# Patient Record
Sex: Female | Born: 1945 | Race: White | Hispanic: No | Smoking: Never smoker
Health system: Southern US, Community
[De-identification: ages and names within clinical notes are randomized; demographics above are authoritative.]

## PROBLEM LIST (undated history)

## (undated) DIAGNOSIS — M1990 Unspecified osteoarthritis, unspecified site: Secondary | ICD-10-CM

---

## 2016-01-15 ENCOUNTER — Encounter (HOSPITAL_COMMUNITY): Payer: Self-pay | Admitting: Emergency Medicine

## 2016-01-15 ENCOUNTER — Observation Stay (HOSPITAL_COMMUNITY)
Admission: EM | Admit: 2016-01-15 | Discharge: 2016-01-16 | Disposition: A | Discharge status: Home / Self Care | Payer: Medicare Other | Attending: Family Medicine | Admitting: Family Medicine

## 2016-01-15 ENCOUNTER — Emergency Department (HOSPITAL_COMMUNITY): Payer: Medicare Other

## 2016-01-15 ENCOUNTER — Observation Stay (HOSPITAL_COMMUNITY): Payer: Medicare Other

## 2016-01-15 DIAGNOSIS — E669 Obesity, unspecified: Secondary | ICD-10-CM | POA: Insufficient documentation

## 2016-01-15 DIAGNOSIS — Z880 Allergy status to penicillin: Secondary | ICD-10-CM | POA: Insufficient documentation

## 2016-01-15 DIAGNOSIS — M1990 Unspecified osteoarthritis, unspecified site: Secondary | ICD-10-CM

## 2016-01-15 DIAGNOSIS — Z6835 Body mass index (BMI) 35.0-35.9, adult: Secondary | ICD-10-CM | POA: Diagnosis not present

## 2016-01-15 DIAGNOSIS — R030 Elevated blood-pressure reading, without diagnosis of hypertension: Secondary | ICD-10-CM

## 2016-01-15 DIAGNOSIS — Z8042 Family history of malignant neoplasm of prostate: Secondary | ICD-10-CM | POA: Diagnosis not present

## 2016-01-15 DIAGNOSIS — R413 Other amnesia: Secondary | ICD-10-CM

## 2016-01-15 DIAGNOSIS — Z791 Long term (current) use of non-steroidal anti-inflammatories (NSAID): Secondary | ICD-10-CM | POA: Insufficient documentation

## 2016-01-15 DIAGNOSIS — Z8249 Family history of ischemic heart disease and other diseases of the circulatory system: Secondary | ICD-10-CM | POA: Insufficient documentation

## 2016-01-15 DIAGNOSIS — I119 Hypertensive heart disease without heart failure: Secondary | ICD-10-CM | POA: Diagnosis not present

## 2016-01-15 DIAGNOSIS — Z823 Family history of stroke: Secondary | ICD-10-CM | POA: Insufficient documentation

## 2016-01-15 DIAGNOSIS — R4182 Altered mental status, unspecified: Secondary | ICD-10-CM | POA: Diagnosis not present

## 2016-01-15 HISTORY — DX: Unspecified osteoarthritis, unspecified site: M19.90

## 2016-01-15 LAB — COMPREHENSIVE METABOLIC PANEL
ALT: 18 U/L (ref 14–54)
ANION GAP: 12 (ref 5–15)
AST: 24 U/L (ref 15–41)
Albumin: 4.1 g/dL (ref 3.5–5.0)
Alkaline Phosphatase: 93 U/L (ref 38–126)
BILIRUBIN TOTAL: 0.5 mg/dL (ref 0.3–1.2)
BUN: 14 mg/dL (ref 6–20)
CHLORIDE: 107 mmol/L (ref 101–111)
CO2: 22 mmol/L (ref 22–32)
Calcium: 9.8 mg/dL (ref 8.9–10.3)
Creatinine, Ser: 0.8 mg/dL (ref 0.44–1.00)
Glucose, Bld: 115 mg/dL — ABNORMAL HIGH (ref 65–99)
POTASSIUM: 4.2 mmol/L (ref 3.5–5.1)
Sodium: 141 mmol/L (ref 135–145)
TOTAL PROTEIN: 7.6 g/dL (ref 6.5–8.1)

## 2016-01-15 LAB — CBC WITH DIFFERENTIAL/PLATELET
Basophils Absolute: 0 10*3/uL (ref 0.0–0.1)
Basophils Relative: 0 %
EOS PCT: 1 %
Eosinophils Absolute: 0.1 10*3/uL (ref 0.0–0.7)
HEMATOCRIT: 44.2 % (ref 36.0–46.0)
Hemoglobin: 15.1 g/dL — ABNORMAL HIGH (ref 12.0–15.0)
LYMPHS PCT: 17 %
Lymphs Abs: 1.5 10*3/uL (ref 0.7–4.0)
MCH: 27.8 pg (ref 26.0–34.0)
MCHC: 34.2 g/dL (ref 30.0–36.0)
MCV: 81.4 fL (ref 78.0–100.0)
MONO ABS: 0.6 10*3/uL (ref 0.1–1.0)
MONOS PCT: 7 %
NEUTROS ABS: 6.7 10*3/uL (ref 1.7–7.7)
Neutrophils Relative %: 75 %
PLATELETS: 297 10*3/uL (ref 150–400)
RBC: 5.43 MIL/uL — ABNORMAL HIGH (ref 3.87–5.11)
RDW: 13 % (ref 11.5–15.5)
WBC: 8.9 10*3/uL (ref 4.0–10.5)

## 2016-01-15 LAB — SALICYLATE LEVEL: Salicylate Lvl: <7 mg/dL (ref 2.8–30.0)

## 2016-01-15 LAB — ETHANOL

## 2016-01-15 LAB — URINE MICROSCOPIC-ADD ON

## 2016-01-15 LAB — I-STAT TROPONIN, ED: TROPONIN I, POC: 0 ng/mL (ref 0.00–0.08)

## 2016-01-15 LAB — URINALYSIS, ROUTINE W REFLEX MICROSCOPIC
BILIRUBIN URINE: NEGATIVE
Glucose, UA: NEGATIVE mg/dL
KETONES UR: NEGATIVE mg/dL
NITRITE: NEGATIVE
PH: 6 (ref 5.0–8.0)
PROTEIN: NEGATIVE mg/dL
Specific Gravity, Urine: 1.009 (ref 1.005–1.030)

## 2016-01-15 LAB — RAPID URINE DRUG SCREEN, HOSP PERFORMED
Amphetamines: NOT DETECTED
BARBITURATES: NOT DETECTED
Benzodiazepines: NOT DETECTED
COCAINE: NOT DETECTED
Opiates: NOT DETECTED
TETRAHYDROCANNABINOL: NOT DETECTED

## 2016-01-15 LAB — TSH: TSH: 1.856 u[IU]/mL (ref 0.350–4.500)

## 2016-01-15 LAB — ACETAMINOPHEN LEVEL

## 2016-01-15 MED ORDER — THIAMINE HCL 100 MG/ML IJ SOLN
100.0000 mg | Freq: Every day | INTRAMUSCULAR | Status: DC
  Administered 2016-01-15 – 2016-01-16 (×2): 100 mg via INTRAVENOUS
  Filled 2016-01-15 (×2): qty 2

## 2016-01-15 MED ORDER — SODIUM CHLORIDE 0.9% FLUSH: 3.0000 mL | INTRAVENOUS | Status: DC | PRN

## 2016-01-15 MED ORDER — STROKE: EARLY STAGES OF RECOVERY BOOK
Freq: Once | Status: AC
  Administered 2016-01-15: 19:00:00

## 2016-01-15 MED ORDER — SODIUM CHLORIDE 0.9 % IV SOLN: 250.0000 mL | INTRAVENOUS | Status: DC | PRN

## 2016-01-15 MED ORDER — SODIUM CHLORIDE 0.9% FLUSH
3.0000 mL | Freq: Two times a day (BID) | INTRAVENOUS | Status: DC
  Administered 2016-01-15 – 2016-01-16 (×2): 3 mL via INTRAVENOUS

## 2016-01-15 MED ORDER — LABETALOL HCL 5 MG/ML IV SOLN: 10.0000 mg | Freq: Once | INTRAVENOUS | Status: DC

## 2016-01-15 MED ORDER — SENNOSIDES-DOCUSATE SODIUM 8.6-50 MG PO TABS: 1.0000 | ORAL_TABLET | Freq: Every evening | ORAL | Status: DC | PRN

## 2016-01-15 MED ORDER — ACETAMINOPHEN 650 MG RE SUPP: 650.0000 mg | RECTAL | Status: DC | PRN

## 2016-01-15 MED ORDER — ACETAMINOPHEN 325 MG PO TABS: 650.0000 mg | ORAL_TABLET | ORAL | Status: DC | PRN

## 2016-01-15 NOTE — ED Notes (Signed)
Called to notify that patient is in MRI and will be transported upstairs after she returns to ED Room.

## 2016-01-15 NOTE — Progress Notes (Signed)
Patient admitted from ED. Patient alert and oriented x 2. Patient and family oriented to room and made comfortable. Tele placed and was verified.

## 2016-01-15 NOTE — ED Triage Notes (Signed)
Pt to ER BIB [facility] from home. Pt drove herself to the fire station "not sure why." Pt is experiencing confusion, not per baseline per daughter, repetitive questioning noted. No hx of CVA. No medical hx per patient. No medications per patient. No neuro deficits at this time. BP noted to be 210/109, on arrival 201/99, HR 110 ST. Daughter at bedside stating saw patient at home this morning at 8 am and patient was at baseline a/o x4. At this time patient is alert to self and place, unsure of time/day.

## 2016-01-15 NOTE — ED Notes (Signed)
Patient ambulated to restroom with steady gait.

## 2016-01-15 NOTE — Discharge Summary (Signed)
[facility]  Patient name: Taylor KillDiane Stokes Medical record number: [id number] Date of birth: [birth year] Age: 70 y.o. Gender: female Date of Admission: 01/15/2016  Date of Discharge: 01/16/16 Admitting Physician: Nestor RampSara L Neal, MD  Primary Care Provider: Ellene Routeoyle, Natalie Consultants: neuro  Indication for Hospitalization: altered mental status/ amnesia  Discharge Diagnoses/Problem List:  Principal Problem:   Altered mental status Active Problems:   Osteoarthritis   Elevated blood-pressure reading without diagnosis of hypertension  Disposition: Discharge home  Discharge Condition: Stable  Discharge Exam:  From Progress Note from Day of Discharge:  Temp:  [97.4 F (36.3 C)-99.5 F (37.5 C)] 98.4 F (36.9 C) (11/05 0600) Pulse Rate:  [72-115] 74 (11/05 0600) Resp:  [14-26] 17 (11/05 0600) BP: (129-201)/(53-104) 144/61 (11/05 0600) SpO2:  [95 %-99 %] 99 % (11/05 0600) Weight:  [243 lb 6.2 oz (110.4 kg)] 243 lb 6.2 oz (110.4 kg) (11/04 1828) Physical Exam: General: resting in bed, easily roused, NAD Cardiovascular: RRR, no murmurs Respiratory: CTAB, normal WOB on room air Abdomen: obese, soft, NT/ND, +BS Extremities: WWP, no cyanosis/ edema Neuro:  Follows commands, no focal deficits, AOx3, 5/5 UE and LE strength. Light touch sensation in tact.  Brief Hospital Course:  Taylor Hubbard is a 70 y.o. female that [facility] with amnesia.  On admission, she was noted to have a BP 201/99, which normalized without intervention.  She was unaware of how she arrived to ED.  Her exam was non-focal except for repeatedly asking the same questions every 30-45 seconds.  Her long term memory seemed to be preserved.  CT head was negative for acute processes.  Her CBC and CMP were unremarkable.  iTroponin was negative.  Blood alcohol level negative.  Neurology was consulted and recommended MRI of brain with continued stroke work up but believed patient  to have transient global amnesia.  MRI brain was unremarkable.  UDS was negative, Tylenol level negative, Aspirin level negative.  She had a rohpynol level sent out, which was pending at discahrge.  Risk stratification labs for stroke were obtained and are noted below. Patient did not require PT. OT evaluated patient and did not have further recommendations. Patient was started on ASA 81mg  daily per neurology reccomendations.   Patient had no apparent risk factors for stroke.  Her only PMH was Osteoarthritis.  Her home Mobic was held during hospitalization.  Issues for Follow Up:  - Elevated BP   Significant Procedures: none  Significant Labs and Imaging:   Recent Labs Lab 01/15/16 1437 01/16/16 0700  WBC 8.9 8.3  HGB 15.1* 15.1*  HCT 44.2 45.9  PLT 297 321    Recent Labs Lab 01/15/16 1437  NA 141  K 4.2  CL 107  CO2 22  GLUCOSE 115*  BUN 14  CREATININE 0.80  CALCIUM 9.8  ALKPHOS 93  AST 24  ALT 18  ALBUMIN 4.1  TSH 1.856 UDS: negative UA: trace Leukocytes, small hgb  istat troponin 0.00 Ethanol <5 Salicylate: <7 Acetaminophen: < 10  Lipid: total cholesterol 195, TAG 121, HDL 47, VLDL 24, LDL 124.  Hemoblobin A1c: pending on discharge   Ct Head Wo Contrast  Result Date: 01/15/2016 CLINICAL DATA:  Amnesia, confusion EXAM: CT HEAD WITHOUT CONTRAST TECHNIQUE: Contiguous axial images were obtained from the base of the skull through the vertex without intravenous contrast. COMPARISON:  None available FINDINGS: Brain: No evidence of acute infarction, hemorrhage, hydrocephalus, extra-axial collection or mass lesion/mass effect. Vascular: No hyperdense vessel or  unexpected calcification. Skull: Normal. Negative for fracture or focal lesion. Sinuses/Orbits: No acute finding. Other: None. IMPRESSION: Normal head CT without contrast for age. Electronically Signed   By: Judie PetitM.  Shick M.D.   On: 01/15/2016 15:24   Mr Brain Wo Contrast  Result Date: 01/15/2016 CLINICAL DATA:   Initial evaluation for acute amnesia. EXAM: MRI HEAD WITHOUT CONTRAST TECHNIQUE: Multiplanar, multiecho pulse sequences of the brain and surrounding structures were obtained without intravenous contrast. COMPARISON:  Prior CT from 01/15/2016. FINDINGS: Brain: Cerebral volume within normal limits for age. No focal parenchymal signal abnormality identified. No significant cerebral white matter disease. No abnormal foci of restricted diffusion to suggest acute or subacute ischemia. Gray-white matter differentiation well maintained. No evidence for acute or chronic intracranial hemorrhage. No areas of chronic infarction identified. No mass lesion, midline shift, or mass effect. No hydrocephalus. No extra-axial fluid collection. Major dural sinuses are grossly patent. Pituitary gland within normal limits. Vascular: Major intracranial vascular flow voids are maintained. Skull and upper cervical spine: Craniocervical junction normal. Visualized upper cervical spine unremarkable. Bone marrow signal intensity within normal limits. No scalp soft tissue abnormality. Sinuses/Orbits: Globes and orbital soft tissues within normal limits. Paranasal sinuses are clear. No mastoid effusion. Inner ear structures within normal limits. IMPRESSION: Normal brain MRI. Electronically Signed   By: Rise MuBenjamin  McClintock M.D.   On: 01/15/2016 17:51   Results/Tests Pending at Time of Discharge: hemoglobin A1c, rohpynol level  Discharge Medications:    Medication List    TAKE these medications   aspirin EC 81 MG tablet Take 1 tablet (81 mg total) by mouth daily.   meloxicam 7.5 MG tablet Commonly known as:  MOBIC Take 7.5 mg by mouth daily.       Discharge Instructions: Please refer to Patient Instructions section of EMR for full details.  Patient was counseled important signs and symptoms that should prompt return to medical care, changes in medications, dietary instructions, activity restrictions, and follow up appointments.    Follow-Up Appointments: Follow-up Information    Ellene Routeatalie Doyle Follow up.   Specialty:  Internal Medicine Why:  Please make a hospital follow up appointment as soon as possible.  Contact information: [address] [place] [id number] [phone]           Palma HolterKanishka G Gunadasa, MD 01/16/2016, 12:47 PM PGY-2, [facility]

## 2016-01-15 NOTE — ED Notes (Signed)
Pt delayed to go to the floor due to IV infiltrating

## 2016-01-15 NOTE — ED Notes (Signed)
Patient transported to MRI 

## 2016-01-15 NOTE — Consult Note (Signed)
Reason for Consult: Memory loss Referring Physician: ER  Taylor Hubbard is an 70 y.o. female.  HPI: Patient is visiting daughters from [place].  She was last seen this am 8 am by one of the daughters and was normal.  At 2:30 pm, the daughter was called by fire dept as she had driven there confused.  She has no recollection of what happened.  She keeps repeating the same questions over and over again.  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  No definite intense physical activity, sex, or cold shower.  She does not have any stroke risk factors that she knows of, but her BP has been around 220/110 upon arrival..    History reviewed. No pertinent past medical history.  History reviewed. No pertinent surgical history.  Family History  Problem Relation Age of Onset  . Stroke Mother     Social History:  reports that she has never smoked. She has never used smokeless tobacco. She reports that she does not drink alcohol. Her drug history is not on file.  Allergies:  Allergies  Allergen Reactions  . Penicillins Rash    Prior to Admission medications   Medication Sig Start Date End Date Taking? Authorizing Provider  meloxicam (MOBIC) 7.5 MG tablet Take 7.5 mg by mouth daily.   Yes Historical Provider, MD    Medications: Prior to Admission:  (Not in a hospital admission)  Results for orders placed or performed during the hospital encounter of 01/15/16 (from the past 48 hour(s))  CBC with Differential     Status: Abnormal   Collection Time: 01/15/16  2:37 PM  Result Value Ref Range   WBC 8.9 4.0 - 10.5 K/uL   RBC 5.43 (H) 3.87 - 5.11 MIL/uL   Hemoglobin 15.1 (H) 12.0 - 15.0 g/dL   HCT 44.2 36.0 - 46.0 %   MCV 81.4 78.0 - 100.0 fL   MCH 27.8 26.0 - 34.0 pg   MCHC 34.2 30.0 - 36.0 g/dL   RDW 13.0 11.5 - 15.5 %   Platelets 297 150 - 400 K/uL   Neutrophils Relative % 75 %   Neutro Abs 6.7 1.7 - 7.7 K/uL   Lymphocytes Relative 17 %   Lymphs Abs 1.5 0.7  - 4.0 K/uL   Monocytes Relative 7 %   Monocytes Absolute 0.6 0.1 - 1.0 K/uL   Eosinophils Relative 1 %   Eosinophils Absolute 0.1 0.0 - 0.7 K/uL   Basophils Relative 0 %   Basophils Absolute 0.0 0.0 - 0.1 K/uL  Comprehensive metabolic panel     Status: Abnormal   Collection Time: 01/15/16  2:37 PM  Result Value Ref Range   Sodium 141 135 - 145 mmol/L   Potassium 4.2 3.5 - 5.1 mmol/L   Chloride 107 101 - 111 mmol/L   CO2 22 22 - 32 mmol/L   Glucose, Bld 115 (H) 65 - 99 mg/dL   BUN 14 6 - 20 mg/dL   Creatinine, Ser 0.80 0.44 - 1.00 mg/dL   Calcium 9.8 8.9 - 10.3 mg/dL   Total Protein 7.6 6.5 - 8.1 g/dL   Albumin 4.1 3.5 - 5.0 g/dL   AST 24 15 - 41 U/L   ALT 18 14 - 54 U/L   Alkaline Phosphatase 93 38 - 126 U/L   Total Bilirubin 0.5 0.3 - 1.2 mg/dL   GFR calc non Af Amer >60 >60 mL/min   GFR calc Af Amer >60 >60 mL/min  Comment: (NOTE) The eGFR has been calculated using the CKD EPI equation. This calculation has not been validated in all clinical situations. eGFR's persistently <60 mL/min signify possible Chronic Kidney Disease.    Anion gap 12 5 - 15  I-Stat Troponin, ED (not at [facility])     Status: None   Collection Time: 01/15/16  2:40 PM  Result Value Ref Range   Troponin i, poc 0.00 0.00 - 0.08 ng/mL   Comment 3            Comment: Due to the release kinetics of cTnI, a negative result within the first hours of the onset of symptoms does not rule out myocardial infarction with certainty. If myocardial infarction is still suspected, repeat the test at appropriate intervals.     Ct Head Wo Contrast  Result Date: 01/15/2016 CLINICAL DATA:  Amnesia, confusion EXAM: CT HEAD WITHOUT CONTRAST TECHNIQUE: Contiguous axial images were obtained from the base of the skull through the vertex without intravenous contrast. COMPARISON:  None available FINDINGS: Brain: No evidence of acute infarction, hemorrhage, hydrocephalus, extra-axial collection or mass lesion/mass effect.  Vascular: No hyperdense vessel or unexpected calcification. Skull: Normal. Negative for fracture or focal lesion. Sinuses/Orbits: No acute finding. Other: None. IMPRESSION: Normal head CT without contrast for age. Electronically Signed   By: Jerilynn Mages.  Shick M.D.   On: 01/15/2016 15:24    ROS Blood pressure 186/97, pulse 97, temperature 97.4 F (36.3 C), resp. rate 20, last menstrual period 01/15/2016, SpO2 96 %. Neurologic Examination:  Normal completely except repeating the same questions over and over again.  Assessment/Plan:  Transient Global Amnesia.  No definite provocative factor identified.  Most cases are not associated with a completed stroke, but would get MRI to ensure no infarct in the hippocampus.  Most prevailing theory is that it occurs due to incompetent internal jugular veins allowing deoxygenated blood to bathe the hippocampi.  Most cases resolve within 24 hours.    If there is no stroke on MRI, I would be more aggressive in controlling her BP.  If there is , then permissive hypertension for 24 hours.    Admit for observation overnight.     Rogue Jury, MD 01/15/2016, 3:43 PM

## 2016-01-15 NOTE — H&P (Signed)
[facility] Ad[facility] History and Physical Service Pager: [phone]  Patient name: Taylor Hubbard Medical record number: [id number] Date of birth: 1946/01/11 Age: 70 y.o. Gender: female  Primary Care Provider: Ellene Routeoyle, Natalie Consultants: None Code Status: Full  Chief Complaint: Altered mental status  Assessment and Plan: Taylor Hubbard is a 70 y.o. female presenting with AMS. PMH is significant for obesity and osteoarthritis otherwise negative.  #Altered Mental Status, Acute, Unknown Source: New onset 11/04 sometime around 1430. 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 BP has since improved to 158/74 without medication.  CMP and CBC unremarkable.  iTrop neg. Blood alcohol level neg. CT of head w/o contrast neg for ischemia or hemorrhaging. MRI pending. Patient was evaluated by neurology who did not appreciate deficits other than short term memory loss. Patient repeats questions about 30 seconds apart in coherent sentences without slurring of speech. She is A&O only to self and long term memory appears to be intact. Neuro exam was unremarkable for other focal deficits. Patient denies h/o previous TIA/stroke or memory impairment and daughters confirm. Never smoker and denies EtOH use or h/o illicit drug exposure. No history of significant risk factors including HTN, DM2, early family h/o Stroke/ Cardiovascular disease.  Ddx includes transient global amnesia, stroke, drug exposure. --Admit to obs on med-surg, attending Dr. Jennette KettleNeal --Thiamine 100 mg IV QD --MRI brain w/o contrast pending --Will obtain carotid doppler --Will obtain ECHO --Neuro aware, appreciate recs, will consult if MRI is concerning or change in mentation worsens or does not improve over next 24 hrs --Neuro check q2h for first 12 hrs, then q4h --Risk  stratification labs pending including TSH, lipid, A1c --Salicylate, Tylenol levels --UDS pending including send out for rohypnol --Vitals q2h for first 12 hrs, then q4h --PT/OT, SLP evaluation --Fall precautions  #Osteoarthritis, Chronic, Controlled:  On mobic qd prn, takes glucosamine/chondroitin at home. --Hold NSAID for now given concern for stroke --Tylenol 650 mg q4h --Monitor pain level  FEN/GI: NPO, KVO Prophylaxis: SCDs  Disposition: Pending stroke workup  History of Present Illness:  Taylor Hubbard is a 70 y.o. female presenting with AMS. PMH is significant for obesity and osteoarthritis otherwise negative.  Onset around 1430 today when patient drove to a local fire department in her car alone and was found repeating questions in confusion and was hypertensive in the 200-210/90-100. [place] in [place] and is visiting for a wedding today which she was running errands for earlier according to the daughter. Patient was seen by another daughter around 0800 today and was noted to be in normal state with appropriate mentation. Her only known medical problems are obesity and OA but otherwise negative for HTN, HLD, DM2, CAD, prior TIA/stroke or h/o dementia, memory loss. She has never experienced similar episode before. Resides independently at home with full ADLs and is active daily. Denies fevers, chills, headache, nausea, vomiting, visual disturbance, dyspnea, chest pain, or change in speech.  No known exposures to chemicals, drugs.  Does not drink, smoke or take drugs.  Review Of Systems: Complete ROS performed, please refer to HPI for pertinent ROS.  Patient Active Problem List   Diagnosis Date Noted  . Altered mental status 01/15/2016  . Osteoarthritis 01/15/2016  . Elevated blood-pressure reading without diagnosis of hypertension 01/15/2016    Past Medical History: Past Medical History:  Diagnosis Date  . Osteoarthritis  Past Surgical History: History  reviewed. No pertinent surgical history.  Social History: Social History  Substance Use Topics  . Smoking status: Never Smoker  . Smokeless tobacco: Never Used  . Alcohol use No   Additional social history: denies illicit drug exposure. Please also refer to relevant sections of EMR.  Family History: Family History  Problem Relation Age of Onset  . Stroke Mother 87  . Atrial fibrillation Father71   . Prostate cancer Father     Allergies and Medications: Allergies  Allergen Reactions  . Penicillins Rash   No current facility-administered medications on file prior to encounter.    No current outpatient prescriptions on file prior to encounter.    Objective: BP 158/74   Pulse 100   Temp 97.4 F (36.3 C)   Resp 26   LMP 01/15/2016   SpO2 97%  Exam: General: obese, well nourished, well developed, in no acute distress with non-toxic appearance HEENT: normocephalic, atraumatic, moist mucous membranes, PERRLA, EOMI, sclera anicteric Neck: supple, non-tender without lymphadenopathy CV: regular rate and rhythm without murmurs, rubs, or gallops Lungs: clear to auscultation bilaterally with normal work of breathing Abdomen: soft, non-tender, no masses or organomegaly palpable, normoactive bowel sounds Skin: warm, dry, no rashes or lesions, cap refill < 2 seconds Extremities: warm and well perfused, normal tone Neuro: CNII-XII intact, no fine-motor deficits, no slurring or speech, ambulates independently without gait abnormalities Psych: A&O only to self, appropriate mood and affect, knows she's in a hospital and that it is fall  Labs and Imaging: Results for orders placed or performed during the hospital encounter of 01/15/16 (from the past 24 hour(s))  CBC with Differential     Status: Abnormal   Collection Time: 01/15/16  2:37 PM  Result Value Ref Range   WBC 8.9 4.0 - 10.5 K/uL   RBC 5.43 (H) 3.87 - 5.11 MIL/uL   Hemoglobin 15.1 (H) 12.0 - 15.0 g/dL   HCT 16.144.2 09.636.0 -  04.546.0 %   MCV 81.4 78.0 - 100.0 fL   MCH 27.8 26.0 - 34.0 pg   MCHC 34.2 30.0 - 36.0 g/dL   RDW 40.913.0 81.111.5 - 91.415.5 %   Platelets 297 150 - 400 K/uL   Neutrophils Relative % 75 %   Neutro Abs 6.7 1.7 - 7.7 K/uL   Lymphocytes Relative 17 %   Lymphs Abs 1.5 0.7 - 4.0 K/uL   Monocytes Relative 7 %   Monocytes Absolute 0.6 0.1 - 1.0 K/uL   Eosinophils Relative 1 %   Eosinophils Absolute 0.1 0.0 - 0.7 K/uL   Basophils Relative 0 %   Basophils Absolute 0.0 0.0 - 0.1 K/uL  Comprehensive metabolic panel     Status: Abnormal   Collection Time: 01/15/16  2:37 PM  Result Value Ref Range   Sodium 141 135 - 145 mmol/L   Potassium 4.2 3.5 - 5.1 mmol/L   Chloride 107 101 - 111 mmol/L   CO2 22 22 - 32 mmol/L   Glucose, Bld 115 (H) 65 - 99 mg/dL   BUN 14 6 - 20 mg/dL   Creatinine, Ser 7.820.80 0.44 - 1.00 mg/dL   Calcium 9.8 8.9 - 95.610.3 mg/dL   Total Protein 7.6 6.5 - 8.1 g/dL   Albumin 4.1 3.5 - 5.0 g/dL   AST 24 15 - 41 U/L   ALT 18 14 - 54 U/L   Alkaline Phosphatase 93 38 - 126 U/L   Total Bilirubin 0.5 0.3 - 1.2 mg/dL   GFR  calc non Af Amer >60 >60 mL/min   GFR calc Af Amer >60 >60 mL/min   Anion gap 12 5 - 15  Acetaminophen level     Status: Abnormal   Collection Time: 01/15/16  2:37 PM  Result Value Ref Range   Acetaminophen (Tylenol), Serum <10 (L) 10 - 30 ug/mL  Salicylate level     Status: None   Collection Time: 01/15/16  2:37 PM  Result Value Ref Range   Salicylate Lvl <7.0 2.8 - 30.0 mg/dL  Ethanol     Status: None   Collection Time: 01/15/16  2:37 PM  Result Value Ref Range   Alcohol, Ethyl (B) <5 <5 mg/dL  I-Stat Troponin, ED (not at [facility])     Status: None   Collection Time: 01/15/16  2:40 PM  Result Value Ref Range   Troponin i, poc 0.00 0.00 - 0.08 ng/mL   Comment 3          Urinalysis, Routine w reflex microscopic (not at [facility])     Status: Abnormal   Collection Time: 01/15/16  2:40 PM  Result Value Ref Range   Color, Urine YELLOW YELLOW   APPearance CLEAR CLEAR    Specific Gravity, Urine 1.009 1.005 - 1.030   pH 6.0 5.0 - 8.0   Glucose, UA NEGATIVE NEGATIVE mg/dL   Hgb urine dipstick SMALL (A) NEGATIVE   Bilirubin Urine NEGATIVE NEGATIVE   Ketones, ur NEGATIVE NEGATIVE mg/dL   Protein, ur NEGATIVE NEGATIVE mg/dL   Nitrite NEGATIVE NEGATIVE   Leukocytes, UA TRACE (A) NEGATIVE  Urine microscopic-add on     Status: Abnormal   Collection Time: 01/15/16  2:40 PM  Result Value Ref Range   Squamous Epithelial / LPF 0-5 (A) NONE SEEN   WBC, UA 0-5 0 - 5 WBC/hpf   RBC / HPF 0-5 0 - 5 RBC/hpf   Bacteria, UA RARE (A) NONE SEEN    Ct Head Wo Contrast  Result Date: 01/15/2016 CLINICAL DATA:  Amnesia, confusion EXAM: CT HEAD WITHOUT CONTRAST TECHNIQUE: Contiguous axial images were obtained from the base of the skull through the vertex without intravenous contrast. COMPARISON:  None available FINDINGS: Brain: No evidence of acute infarction, hemorrhage, hydrocephalus, extra-axial collection or mass lesion/mass effect. Vascular: No hyperdense vessel or unexpected calcification. Skull: Normal. Negative for fracture or focal lesion. Sinuses/Orbits: No acute finding. Other: None. IMPRESSION: Normal head CT without contrast for age. Electronically Signed   By: Judie Petit.  Shick M.D.   On: 01/15/2016 15:24   Wendee Beavers, DO 01/15/2016, 5:31 PM PGY-1, [facility] Intern pager: [phone], text pages welcome  I have separately seen and examined the patient. I have discussed the findings and exam with Dr Abelardo Diesel and agree with the above note.  My changes/additions are outlined in BLUE.   Daimien Patmon M. Nadine Counts, DO PGY-3, [facility]

## 2016-01-15 NOTE — ED Provider Notes (Signed)
[facility]-EMERGENCY DEPT Provider Note   CSN: [id number] Arrival date & time: 01/15/16  1412     History   Chief Complaint Chief Complaint  Patient presents with  . Altered Mental Status    HPI Taylor Hubbard is a 70 y.o. female.  HPI Taylor KillDiane Vanhouten is a 70 y.o. female with no medical problems, presents to ED with complaint of altered mental status. Pt apparently drove herself to a fire station this afternoon. Pt does not recall getting there or any of the events that happened this morning. Pt with repetitive questions. No hx of the same or memory issues. No recent illnesses. Last seen normal at 8am. Denies headache. No pain anywhere else. No dizziness, light headiness, weakness or numbness in extremities. No other complaints.   History reviewed. No pertinent past medical history.  There are no active problems to display for this patient.   History reviewed. No pertinent surgical history.  OB History    No data available       Home Medications    Prior to Admission medications   Not on File    Family History Family History  Problem Relation Age of Onset  . Stroke Mother     Social History Social History  Substance Use Topics  . Smoking status: Never Smoker  . Smokeless tobacco: Never Used  . Alcohol use No     Allergies   Penicillins   Review of Systems Review of Systems  Constitutional: Negative for chills and fever.  Respiratory: Negative for cough, chest tightness and shortness of breath.   Cardiovascular: Negative for chest pain, palpitations and leg swelling.  Gastrointestinal: Negative for abdominal pain, diarrhea, nausea and vomiting.  Genitourinary: Negative for dysuria and flank pain.  Musculoskeletal: Negative for arthralgias, myalgias, neck pain and neck stiffness.  Skin: Negative for rash.  Neurological: Negative for dizziness, weakness and headaches.  Psychiatric/Behavioral: Positive for confusion.  All other systems reviewed and are  negative.    Physical Exam Updated Vital Signs BP (!) 201/99 (BP Location: Right Arm)   Pulse 110   Temp 97.4 F (36.3 C) (Oral)   Resp 14   LMP 01/15/2016   SpO2 98%   Physical Exam  Constitutional: She appears well-developed and well-nourished. No distress.  HENT:  Head: Normocephalic.  Eyes: Conjunctivae and EOM are normal. Pupils are equal, round, and reactive to light.  Neck: Normal range of motion. Neck supple.  Cardiovascular: Normal rate, regular rhythm and normal heart sounds.   Pulmonary/Chest: Effort normal and breath sounds normal. No respiratory distress. She has no wheezes. She has no rales.  Abdominal: Soft. Bowel sounds are normal. She exhibits no distension. There is no tenderness. There is no rebound.  Musculoskeletal: She exhibits no edema.  Neurological: She is alert. No cranial nerve deficit. She exhibits normal muscle tone. Coordination normal.  oriented x2 to self and place. Repetitive questioning.  5/5 and equal upper and lower extremity strength bilaterally. Equal grip strength bilaterally. Normal finger to nose and heel to shin. No pronator drift.   Skin: Skin is warm and dry. Capillary refill takes less than 2 seconds.  Psychiatric: She has a normal mood and affect. Her behavior is normal.  Nursing note and vitals reviewed.    ED Treatments / Results  Labs (all labs ordered are listed, but only abnormal results are displayed) Labs Reviewed  CBC WITH DIFFERENTIAL/PLATELET - Abnormal; Notable for the following:       Result Value   RBC 5.43 (*)  Hemoglobin 15.1 (*)    All other components within normal limits  COMPREHENSIVE METABOLIC PANEL - Abnormal; Notable for the following:    Glucose, Bld 115 (*)    All other components within normal limits  URINALYSIS, ROUTINE W REFLEX MICROSCOPIC (NOT AT [facility]) - Abnormal; Notable for the following:    Hgb urine dipstick SMALL (*)    Leukocytes, UA TRACE (*)    All other components within normal limits    URINE MICROSCOPIC-ADD ON - Abnormal; Notable for the following:    Squamous Epithelial / LPF 0-5 (*)    Bacteria, UA RARE (*)    All other components within normal limits  ACETAMINOPHEN LEVEL - Abnormal; Notable for the following:    Acetaminophen (Tylenol), Serum <10 (*)    All other components within normal limits  LIPID PANEL - Abnormal; Notable for the following:    LDL Cholesterol 124 (*)    All other components within normal limits  CBC - Abnormal; Notable for the following:    RBC 5.50 (*)    Hemoglobin 15.1 (*)    All other components within normal limits  RAPID URINE DRUG SCREEN, HOSP PERFORMED  SALICYLATE LEVEL  ETHANOL  TSH  MISC [facility] TEST (SEND OUT)  HEMOGLOBIN A1C  I-STAT TROPOININ, ED    EKG  EKG Interpretation None       Radiology Ct Head Wo Contrast  Result Date: 01/15/2016 CLINICAL DATA:  Amnesia, confusion EXAM: CT HEAD WITHOUT CONTRAST TECHNIQUE: Contiguous axial images were obtained from the base of the skull through the vertex without intravenous contrast. COMPARISON:  None available FINDINGS: Brain: No evidence of acute infarction, hemorrhage, hydrocephalus, extra-axial collection or mass lesion/mass effect. Vascular: No hyperdense vessel or unexpected calcification. Skull: Normal. Negative for fracture or focal lesion. Sinuses/Orbits: No acute finding. Other: None. IMPRESSION: Normal head CT without contrast for age. Electronically Signed   By: Judie Petit.  Shick M.D.   On: 01/15/2016 15:24   Mr Brain Wo Contrast  Result Date: 01/15/2016 CLINICAL DATA:  Initial evaluation for acute amnesia. EXAM: MRI HEAD WITHOUT CONTRAST TECHNIQUE: Multiplanar, multiecho pulse sequences of the brain and surrounding structures were obtained without intravenous contrast. COMPARISON:  Prior CT from 01/15/2016. FINDINGS: Brain: Cerebral volume within normal limits for age. No focal parenchymal signal abnormality identified. No significant cerebral white matter disease. No abnormal  foci of restricted diffusion to suggest acute or subacute ischemia. Gray-white matter differentiation well maintained. No evidence for acute or chronic intracranial hemorrhage. No areas of chronic infarction identified. No mass lesion, midline shift, or mass effect. No hydrocephalus. No extra-axial fluid collection. Major dural sinuses are grossly patent. Pituitary gland within normal limits. Vascular: Major intracranial vascular flow voids are maintained. Skull and upper cervical spine: Craniocervical junction normal. Visualized upper cervical spine unremarkable. Bone marrow signal intensity within normal limits. No scalp soft tissue abnormality. Sinuses/Orbits: Globes and orbital soft tissues within normal limits. Paranasal sinuses are clear. No mastoid effusion. Inner ear structures within normal limits. IMPRESSION: Normal brain MRI. Electronically Signed   By: Rise Mu M.D.   On: 01/15/2016 17:51    Procedures Procedures (including critical care time)  Medications Ordered in ED Medications - No data to display   Initial Impression / Assessment and Plan / ED Course  I have reviewed the triage vital signs and the nursing notes.  Pertinent labs & imaging results that were available during my care of the patient were reviewed by me and considered in my medical decision making (see  chart for details).  Clinical Course   2:34 PM Pt seen and examined. Pt with amnesia and repetitive questions. Otherwise normal neurological exam. Will get labs, CT head, monitor.   3:14 PM BP improved will hold labetalol. Spoke with neurology, they will evaluate  Recommended MRI brain and admission for obs.   Vitals:   01/16/16 0400 01/16/16 0600 01/16/16 1012 01/16/16 1230  BP: (!) 129/53 (!) 144/61 139/74 132/76  Pulse: 72 74 81 76  Resp:  17 17 16   Temp: 99.2 F (37.3 C) 98.4 F (36.9 C) 98.1 F (36.7 C) 98.9 F (37.2 C)  TempSrc: Oral Oral Oral Oral  SpO2: 99% 99% 98% 95%  Weight:        Height:          Final Clinical Impressions(s) / ED Diagnoses   Final diagnoses:  Amnesia    New Prescriptions Discharge Medication List as of 01/16/2016  2:14 PM    START taking these medications   Details  aspirin EC 81 MG tablet Take 1 tablet (81 mg total) by mouth daily., Starting Sun 01/16/2016, No Print         Jaynie Crumbleatyana Valencia Kassa, PA-C 01/16/16 1535    Loren Raceravid Yelverton, MD 01/17/16 [phone]

## 2016-01-15 NOTE — ED Notes (Signed)
Patient brought to the ED today due to altered mental status and global amnesia.  Patient does not remember the reason why she is in the hospital or how she got here.  MRI completed and patient returned to the ED at 17:43. Patient can ambulate without assistance and has a steady gait. Passed swallow screen at 14:31. Patient NIH was 1. Patient denies pain. Patient has been hypertensive intermittently but no medications have been given.

## 2016-01-15 NOTE — ED Notes (Signed)
Patient ambulated to the bathroom without problem. Patient is still confused.

## 2016-01-16 LAB — CBC
HCT: 45.9 % (ref 36.0–46.0)
Hemoglobin: 15.1 g/dL — ABNORMAL HIGH (ref 12.0–15.0)
MCH: 27.5 pg (ref 26.0–34.0)
MCHC: 32.9 g/dL (ref 30.0–36.0)
MCV: 83.5 fL (ref 78.0–100.0)
Platelets: 321 10*3/uL (ref 150–400)
RBC: 5.5 MIL/uL — ABNORMAL HIGH (ref 3.87–5.11)
RDW: 13.6 % (ref 11.5–15.5)
WBC: 8.3 10*3/uL (ref 4.0–10.5)

## 2016-01-16 LAB — LIPID PANEL
CHOL/HDL RATIO: 4.1 ratio
CHOLESTEROL: 195 mg/dL (ref 0–200)
HDL: 47 mg/dL (ref >40)
LDL Cholesterol: 124 mg/dL — ABNORMAL HIGH (ref 0–99)
Triglycerides: 121 mg/dL (ref <150)
VLDL: 24 mg/dL (ref 0–40)

## 2016-01-16 MED ORDER — ASPIRIN EC 81 MG PO TBEC: 81.0000 mg | DELAYED_RELEASE_TABLET | Freq: Every day | ORAL | Status: AC

## 2016-01-16 NOTE — Progress Notes (Signed)
PT Cancellation Note  Patient Details Name: Taylor Hubbard MRN: [id number] DOB: 08/08/1945   Cancelled Treatment:    Reason Eval/Treat Not Completed: PT screened, no needs identified, will sign off.  Pt walked in halls with no deficits and no loss of balance - pt able to march, walk fast and turn head with no issues.  Pt able to balance on balls of feet and do unilateral stance activities.  Pt limited by her sore knees only.  No skilled PT issues identified.  Daughters present for PT screen.   [name], Taylor Hubbard 01/16/2016, 11:58 AM 01/16/2016   [name], PT

## 2016-01-16 NOTE — Progress Notes (Signed)
Subjective: She remembers everything since 7 pm yesterday and able to put down new memories.  MRI normal.    Objective: Vital signs in last 24 hours: Temp:  [97.4 F (36.3 C)-99.5 F (37.5 C)] 98.1 F (36.7 C) (11/05 1012) Pulse Rate:  [72-115] 81 (11/05 1012) Resp:  [14-26] 17 (11/05 1012) BP: (129-201)/(53-104) 139/74 (11/05 1012) SpO2:  [95 %-99 %] 98 % (11/05 1012) Weight:  [110.4 kg (243 lb 6.2 oz)] 110.4 kg (243 lb 6.2 oz) (11/04 1828)  Intake/Output from previous day: 11/04 0701 - 11/05 0700 In: 3 [I.V.:3] Out: -  Intake/Output this shift: No intake/output data recorded. Nutritional status: Diet regular Room service appropriate? Yes; Fluid consistency: Thin  Neurologic Exam:  Lab Results:  Recent Labs  01/15/16 1437 01/16/16 0700  WBC 8.9 8.3  HGB 15.1* 15.1*  HCT 44.2 45.9  PLT 297 321  NA 141  --   K 4.2  --   CL 107  --   CO2 22  --   GLUCOSE 115*  --   BUN 14  --   CREATININE 0.80  --   CALCIUM 9.8  --    Lipid Panel  Recent Labs  01/16/16 0700  CHOL 195  TRIG 121  HDL 47  CHOLHDL 4.1  VLDL 24  LDLCALC 161124*    Studies/Results: Ct Head Wo Contrast  Result Date: 01/15/2016 CLINICAL DATA:  Amnesia, confusion EXAM: CT HEAD WITHOUT CONTRAST TECHNIQUE: Contiguous axial images were obtained from the base of the skull through the vertex without intravenous contrast. COMPARISON:  None available FINDINGS: Brain: No evidence of acute infarction, hemorrhage, hydrocephalus, extra-axial collection or mass lesion/mass effect. Vascular: No hyperdense vessel or unexpected calcification. Skull: Normal. Negative for fracture or focal lesion. Sinuses/Orbits: No acute finding. Other: None. IMPRESSION: Normal head CT without contrast for age. Electronically Signed   By: Judie PetitM.  Shick M.D.   On: 01/15/2016 15:24   Mr Brain Wo Contrast  Result Date: 01/15/2016 CLINICAL DATA:  Initial evaluation for acute amnesia. EXAM: MRI HEAD WITHOUT CONTRAST TECHNIQUE: Multiplanar,  multiecho pulse sequences of the brain and surrounding structures were obtained without intravenous contrast. COMPARISON:  Prior CT from 01/15/2016. FINDINGS: Brain: Cerebral volume within normal limits for age. No focal parenchymal signal abnormality identified. No significant cerebral white matter disease. No abnormal foci of restricted diffusion to suggest acute or subacute ischemia. Gray-white matter differentiation well maintained. No evidence for acute or chronic intracranial hemorrhage. No areas of chronic infarction identified. No mass lesion, midline shift, or mass effect. No hydrocephalus. No extra-axial fluid collection. Major dural sinuses are grossly patent. Pituitary gland within normal limits. Vascular: Major intracranial vascular flow voids are maintained. Skull and upper cervical spine: Craniocervical junction normal. Visualized upper cervical spine unremarkable. Bone marrow signal intensity within normal limits. No scalp soft tissue abnormality. Sinuses/Orbits: Globes and orbital soft tissues within normal limits. Paranasal sinuses are clear. No mastoid effusion. Inner ear structures within normal limits. IMPRESSION: Normal brain MRI. Electronically Signed   By: Rise MuBenjamin  McClintock M.D.   On: 01/15/2016 17:51    Medications:  Scheduled: . sodium chloride flush  3 mL Intravenous Q12H  . thiamine injection  100 mg Intravenous Daily    Assessment/Plan:  Transient Global Amnesia- resolved.  She may never recover yesterday's full memories.  There is a very low risk of recurrence.  Given borderline HTN, I do recommend ASA 81 mg as outpatient.  May discharge home.     LOS: 0 days   Taylor Hubbard,  Candace GallusSHERVIN, MD 01/16/2016  11:22 AM

## 2016-01-16 NOTE — Evaluation (Signed)
Occupational Therapy Evaluation and Discharge Patient Details Name: Taylor KillDiane Zuccaro MRN: [id number] DOB: 03/28/1945 Today's Date: 01/16/2016    History of Present Illness 70 y.o. female presenting with AMS. PMH is significant for obesity and osteoarthritis. CT/MRI on 11/5 negative for acute abnormalities.   Clinical Impression   Pt reports she was independent with ADL PTA. Currently pt is at baseline and is independent with ADL and functional mobility. Pt planning to d/c home alone with intermittent supervision from family/friends. No further acute OT needs identified; signing off at this time. Please re-consult if needs change. Thank you for this referral.    Follow Up Recommendations  No OT follow up    Equipment Recommendations  None recommended by OT    Recommendations for Other Services       Precautions / Restrictions Precautions Precautions: Fall Restrictions Weight Bearing Restrictions: No      Mobility Bed Mobility               General bed mobility comments: Pt OOB in chair upon arrival  Transfers Overall transfer level: Independent                    Balance Overall balance assessment: Independent                                          ADL Overall ADL's : Independent                                       General ADL Comments: Pt able to perform functional activities without unsteadiness or LOB. Cognition appears intact at this time.      Vision Vision Assessment?: No apparent visual deficits   Perception     Praxis      Pertinent Vitals/Pain Pain Assessment: No/denies pain     Hand Dominance Right   Extremity/Trunk Assessment Upper Extremity Assessment Upper Extremity Assessment: Overall WFL for tasks assessed   Lower Extremity Assessment Lower Extremity Assessment: Overall WFL for tasks assessed   Cervical / Trunk Assessment Cervical / Trunk Assessment: Normal   Communication  Communication Communication: No difficulties   Cognition Arousal/Alertness: Awake/alert Behavior During Therapy: WFL for tasks assessed/performed Overall Cognitive Status: Within Functional Limits for tasks assessed                     General Comments       Exercises       Shoulder Instructions      Home Living Family/patient expects to be discharged to:: Private residence Living Arrangements: Alone Available Help at Discharge: Family;Friend(s);Available PRN/intermittently Type of Home: House Home Access: Stairs to enter Entrance Stairs-Number of Steps: 6   Home Layout: Two level;Able to live on main level with bedroom/bathroom     Bathroom Shower/Tub: Producer, television/film/videoWalk-in shower   Bathroom Toilet: Handicapped height     Home Equipment: Other (comment) (walking stick)          Prior Functioning/Environment Level of Independence: Independent with assistive device(s)        Comments: Uses walking stick for mobility in the community. No AD for mobility in the home, independent with ADL, drives.        OT Problem List:     OT Treatment/Interventions:      OT Goals(Current  goals can be found in the care plan section) Acute Rehab OT Goals Patient Stated Goal: return home OT Goal Formulation: All assessment and education complete, DC therapy  OT Frequency:     Barriers to D/C:            Co-evaluation              End of Session    Activity Tolerance: Patient tolerated treatment well Patient left: Other (comment);with family/visitor present (with PT)   Time: [id number] OT Time Calculation (min): 14 min Charges:  OT General Charges $OT Visit: 1 Procedure OT Evaluation $OT Eval Low Complexity: 1 Procedure G-Codes: OT G-codes **NOT FOR INPATIENT CLASS** Functional Assessment Tool Used: Clinical judgement Functional Limitation: Self care Self Care Current Status (Y4034(G8987): 0 percent impaired, limited or restricted Self Care Goal Status (V4259(G8988): 0  percent impaired, limited or restricted Self Care Discharge Status (D6387(G8989): 0 percent impaired, limited or restricted   Gaye AlkenBailey A Kalani Sthilaire M.S., OTR/L Pager: [phone]  01/16/2016, 11:41 AM

## 2016-01-16 NOTE — Progress Notes (Signed)
Family Medicine Teaching Service Daily Progress Note Intern Pager: [phone]  Patient name: Taylor KillDiane Ploch Medical record number: [id number] Date of birth: June 19, 1945 Age: 70 y.o. Gender: female  Primary Care Provider: No primary care provider on file. Consultants: Neuro Code Status: FULL (discussed on admission)  Pt Overview and Major Events to Date:  11/4: Admitted  Assessment and Plan: Taylor Hubbard is a 70 y.o. female presenting with AMS. PMH is significant for obesity and osteoarthritis otherwise negative.  #Altered Mental Status, Acute, Unknown Source: CMP and CBC unremarkable.  iTrop neg. Blood alcohol level neg. CT of head w/o contrast neg for ischemia or hemorrhaging. MRI negative for acute proceesses. UDS neg. Tylenol neg. Salicylate neg. - Thiamine 100 mg IV QD - Since MRI neg, Carotid doppler and Echo cancelled. - Neuro c/s, appreciate recs. Does this patient need to be sent out on an aspirin or require outpatient follow up for TGA? - Send out for rohypnol (have discussed need to collect with Lab/ phlebotomy x3 over last 12 hours.  Still does not appear to be drawn.  Unsure as to how we can get this order done/ utility of order at this point) - Vitals q4h - PT/OT, SLP evaluation - Fall precautions  #Osteoarthritis, Chronic, Controlled: On mobic qd prn, takes glucosamine/chondroitin at home. - May resume Mobic - Tylenol 650 mg q4h - Monitor pain level  FEN/GI: normal diet, SLIV Prophylaxis: SCDs  Disposition: Possible discharge today pending Neuro recs  Subjective:  Patient reports that she is feeling well this morning.  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  She notes that she did deliver the flowers but doesn't recall actually going to the venue.  She notes the first thing she remembers here is speaking with her daughter last evening on 56M.  She denies  headache, dizziness, blurred vision, weakness, numbness/ tingling.    Objective: Temp:  [97.4 F (36.3 C)-99.5 F (37.5 C)] 98.4 F (36.9 C) (11/05 0600) Pulse Rate:  [72-115] 74 (11/05 0600) Resp:  [14-26] 17 (11/05 0600) BP: (129-201)/(53-104) 144/61 (11/05 0600) SpO2:  [95 %-99 %] 99 % (11/05 0600) Weight:  [243 lb 6.2 oz (110.4 kg)] 243 lb 6.2 oz (110.4 kg) (11/04 1828) Physical Exam: General: resting in bed, easily roused, NAD Cardiovascular: RRR, no murmurs Respiratory: CTAB, normal WOB on room air Abdomen: obese, soft, NT/ND, +BS Extremities: WWP, no cyanosis/ edema Neuro:  Follows commands, no focal deficits, AOx3, 5/5 UE and LE strength. Light touch sensation in tact.  Laboratory: Results for orders placed or performed during the hospital encounter of 01/15/16 (from the past 24 hour(s))  CBC with Differential     Status: Abnormal   Collection Time: 01/15/16  2:37 PM  Result Value Ref Range   WBC 8.9 4.0 - 10.5 K/uL   RBC 5.43 (H) 3.87 - 5.11 MIL/uL   Hemoglobin 15.1 (H) 12.0 - 15.0 g/dL   HCT 59.544.2 63.836.0 - 75.646.0 %   MCV 81.4 78.0 - 100.0 fL   MCH 27.8 26.0 - 34.0 pg   MCHC 34.2 30.0 - 36.0 g/dL   RDW 43.313.0 29.511.5 - 18.815.5 %   Platelets 297 150 - 400 K/uL   Neutrophils Relative % 75 %   Neutro Abs 6.7 1.7 - 7.7 K/uL   Lymphocytes Relative 17 %   Lymphs Abs 1.5 0.7 - 4.0 K/uL   Monocytes Relative 7 %  Monocytes Absolute 0.6 0.1 - 1.0 K/uL   Eosinophils Relative 1 %   Eosinophils Absolute 0.1 0.0 - 0.7 K/uL   Basophils Relative 0 %   Basophils Absolute 0.0 0.0 - 0.1 K/uL  Comprehensive metabolic panel     Status: Abnormal   Collection Time: 01/15/16  2:37 PM  Result Value Ref Range   Sodium 141 135 - 145 mmol/L   Potassium 4.2 3.5 - 5.1 mmol/L   Chloride 107 101 - 111 mmol/L   CO2 22 22 - 32 mmol/L   Glucose, Bld 115 (H) 65 - 99 mg/dL   BUN 14 6 - 20 mg/dL   Creatinine, Ser 1.610.80 0.44 - 1.00 mg/dL   Calcium 9.8 8.9 - 09.610.3 mg/dL   Total Protein 7.6 6.5 - 8.1 g/dL    Albumin 4.1 3.5 - 5.0 g/dL   AST 24 15 - 41 U/L   ALT 18 14 - 54 U/L   Alkaline Phosphatase 93 38 - 126 U/L   Total Bilirubin 0.5 0.3 - 1.2 mg/dL   GFR calc non Af Amer >60 >60 mL/min   GFR calc Af Amer >60 >60 mL/min   Anion gap 12 5 - 15  Acetaminophen level     Status: Abnormal   Collection Time: 01/15/16  2:37 PM  Result Value Ref Range   Acetaminophen (Tylenol), Serum <10 (L) 10 - 30 ug/mL  Salicylate level     Status: None   Collection Time: 01/15/16  2:37 PM  Result Value Ref Range   Salicylate Lvl <7.0 2.8 - 30.0 mg/dL  Ethanol     Status: None   Collection Time: 01/15/16  2:37 PM  Result Value Ref Range   Alcohol, Ethyl (B) <5 <5 mg/dL  I-Stat Troponin, ED (not at [facility])     Status: None   Collection Time: 01/15/16  2:40 PM  Result Value Ref Range   Troponin i, poc 0.00 0.00 - 0.08 ng/mL   Comment 3          Urinalysis, Routine w reflex microscopic (not at [facility])     Status: Abnormal   Collection Time: 01/15/16  2:40 PM  Result Value Ref Range   Color, Urine YELLOW YELLOW   APPearance CLEAR CLEAR   Specific Gravity, Urine 1.009 1.005 - 1.030   pH 6.0 5.0 - 8.0   Glucose, UA NEGATIVE NEGATIVE mg/dL   Hgb urine dipstick SMALL (A) NEGATIVE   Bilirubin Urine NEGATIVE NEGATIVE   Ketones, ur NEGATIVE NEGATIVE mg/dL   Protein, ur NEGATIVE NEGATIVE mg/dL   Nitrite NEGATIVE NEGATIVE   Leukocytes, UA TRACE (A) NEGATIVE  Urine microscopic-add on     Status: Abnormal   Collection Time: 01/15/16  2:40 PM  Result Value Ref Range   Squamous Epithelial / LPF 0-5 (A) NONE SEEN   WBC, UA 0-5 0 - 5 WBC/hpf   RBC / HPF 0-5 0 - 5 RBC/hpf   Bacteria, UA RARE (A) NONE SEEN  Urine rapid drug screen (hosp performed)     Status: None   Collection Time: 01/15/16  2:40 PM  Result Value Ref Range   Opiates NONE DETECTED NONE DETECTED   Cocaine NONE DETECTED NONE DETECTED   Benzodiazepines NONE DETECTED NONE DETECTED   Amphetamines NONE DETECTED NONE DETECTED   Tetrahydrocannabinol  NONE DETECTED NONE DETECTED   Barbiturates NONE DETECTED NONE DETECTED  TSH     Status: None   Collection Time: 01/15/16  6:50 PM  Result Value Ref Range  TSH 1.856 0.350 - 4.500 uIU/mL  Lipid panel     Status: Abnormal   Collection Time: 01/16/16  7:00 AM  Result Value Ref Range   Cholesterol 195 0 - 200 mg/dL   Triglycerides 161 <096 mg/dL   HDL 47 >04 mg/dL   Total CHOL/HDL Ratio 4.1 RATIO   VLDL 24 0 - 40 mg/dL   LDL Cholesterol 540 (H) 0 - 99 mg/dL  CBC     Status: Abnormal   Collection Time: 01/16/16  7:00 AM  Result Value Ref Range   WBC 8.3 4.0 - 10.5 K/uL   RBC 5.50 (H) 3.87 - 5.11 MIL/uL   Hemoglobin 15.1 (H) 12.0 - 15.0 g/dL   HCT 98.1 19.1 - 47.8 %   MCV 83.5 78.0 - 100.0 fL   MCH 27.5 26.0 - 34.0 pg   MCHC 32.9 30.0 - 36.0 g/dL   RDW 29.5 62.1 - 30.8 %   Platelets 321 150 - 400 K/uL    Imaging/Diagnostic Tests: Ct Head Wo Contrast  Result Date: 01/15/2016 CLINICAL DATA:  Amnesia, confusion EXAM: CT HEAD WITHOUT CONTRAST TECHNIQUE: Contiguous axial images were obtained from the base of the skull through the vertex without intravenous contrast. COMPARISON:  None available FINDINGS: Brain: No evidence of acute infarction, hemorrhage, hydrocephalus, extra-axial collection or mass lesion/mass effect. Vascular: No hyperdense vessel or unexpected calcification. Skull: Normal. Negative for fracture or focal lesion. Sinuses/Orbits: No acute finding. Other: None. IMPRESSION: Normal head CT without contrast for age. Electronically Signed   By: Judie Petit.  Shick M.D.   On: 01/15/2016 15:24   Mr Brain Wo Contrast  Result Date: 01/15/2016 CLINICAL DATA:  Initial evaluation for acute amnesia. EXAM: MRI HEAD WITHOUT CONTRAST TECHNIQUE: Multiplanar, multiecho pulse sequences of the brain and surrounding structures were obtained without intravenous contrast. COMPARISON:  Prior CT from 01/15/2016. FINDINGS: Brain: Cerebral volume within normal limits for age. No focal parenchymal signal  abnormality identified. No significant cerebral white matter disease. No abnormal foci of restricted diffusion to suggest acute or subacute ischemia. Gray-white matter differentiation well maintained. No evidence for acute or chronic intracranial hemorrhage. No areas of chronic infarction identified. No mass lesion, midline shift, or mass effect. No hydrocephalus. No extra-axial fluid collection. Major dural sinuses are grossly patent. Pituitary gland within normal limits. Vascular: Major intracranial vascular flow voids are maintained. Skull and upper cervical spine: Craniocervical junction normal. Visualized upper cervical spine unremarkable. Bone marrow signal intensity within normal limits. No scalp soft tissue abnormality. Sinuses/Orbits: Globes and orbital soft tissues within normal limits. Paranasal sinuses are clear. No mastoid effusion. Inner ear structures within normal limits. IMPRESSION: Normal brain MRI. Electronically Signed   By: Rise Mu M.D.   On: 01/15/2016 17:51    Raliegh Ip, DO 01/16/2016, 8:20 AM PGY-3, [place] [facility] FPTS Intern pager: [phone], text pages welcome

## 2016-01-16 NOTE — Discharge Instructions (Signed)
You were admitted for altered mental status. Neurology specialists were consulted and you were evaluated. We did not find any signs of a stroke. 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 We are not sure why this occurred. Please follow up with your primary care provider as soon as possilble. The neurologists recommend that you start on aspirin 81mg  daily.

## 2016-01-17 LAB — HEMOGLOBIN A1C
HEMOGLOBIN A1C: 5.7 % — AB (ref 4.8–5.6)
MEAN PLASMA GLUCOSE: 117 mg/dL

## 2016-01-25 NOTE — Progress Notes (Signed)
Charge nurse notified by Dennison MascotKim Spencer, lead processor in Lab that an order was placed for miscellaneous Rohypnol to be drawn and it was not done due to not enough specimen to run the test. Patient is currently discharged.

## 2017-07-31 IMAGING — MR MR HEAD W/O CM
9 of 10 series · 36 of 48 positions shown · non-contrast
Comparison: Prior CT from 01/15/2016.

CLINICAL DATA: Initial evaluation for acute amnesia.

EXAM:
MRI HEAD WITHOUT CONTRAST
TECHNIQUE: Multiplanar, multiecho pulse sequences of the brain and surrounding
structures were obtained without intravenous contrast.

[Series 2: FLAIR · sagittal · 5.0mm · 0.47mm/px · 3 of 23 slices shown (1 of 2)]
[im 1/23]
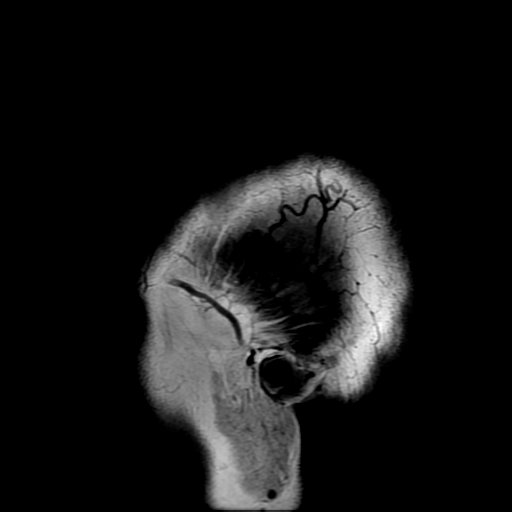
[im 12/23]
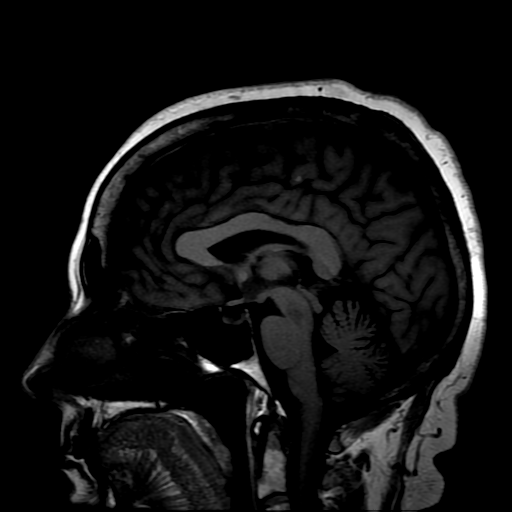
[im 23/23]
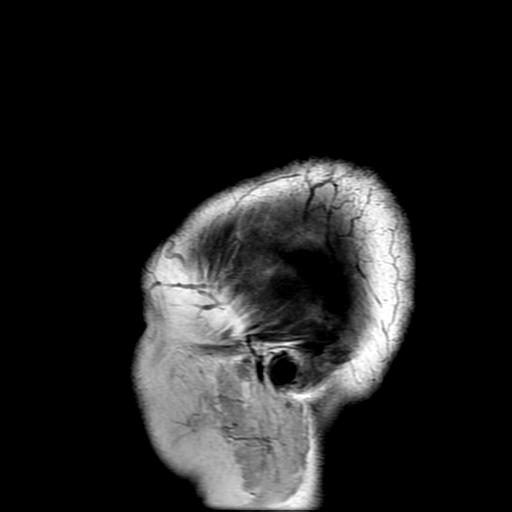

[Series 4: DWI · axial · 3.0mm · 0.94mm/px · z∈[-103,+43]mm · 9 of 100 slices shown (1 of 2)]
[im 1/100]
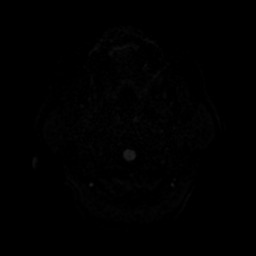
[im 13/100]
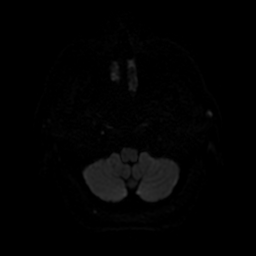
[im 25/100]
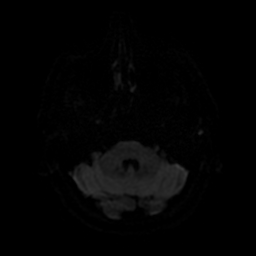
[im 38/100]
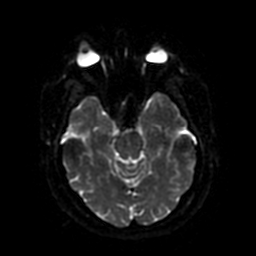
[im 50/100]
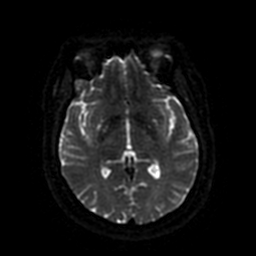
[im 62/100]
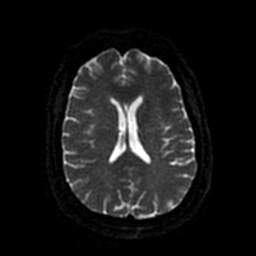
[im 75/100]
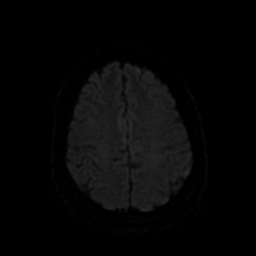
[im 87/100]
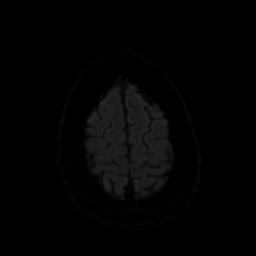
[im 100/100]
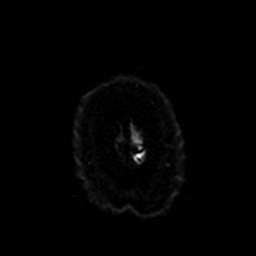

[Series 5: DWI · coronal · 4.0mm · 0.94mm/px · 7 of 72 slices shown (2 of 2)]
[im 1/72]
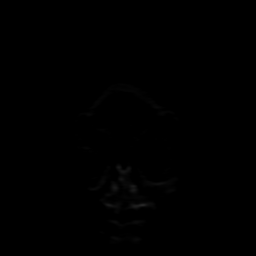
[im 12/72]
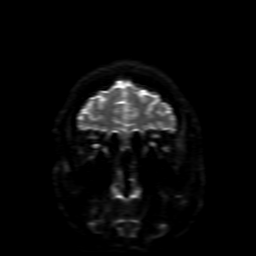
[im 24/72]
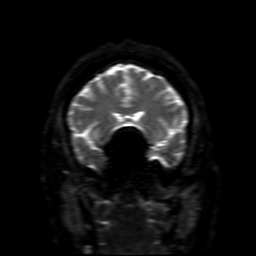
[im 36/72]
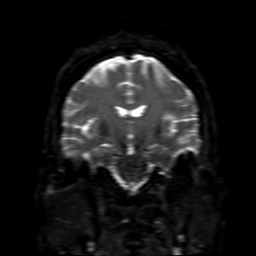
[im 48/72]
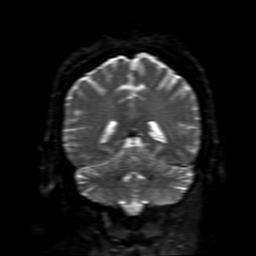
[im 60/72]
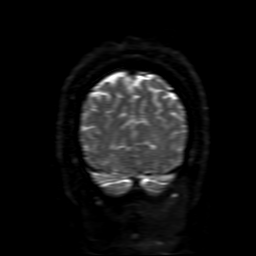
[im 72/72]
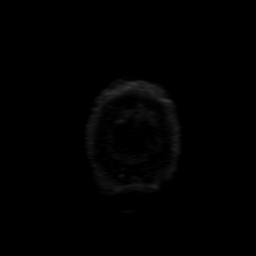

[Series 6: T2 · axial · 5.0mm · 0.47mm/px · z∈[-102,+41]mm · 2 of 25 slices shown (1 of 2)]
[im 1/25]
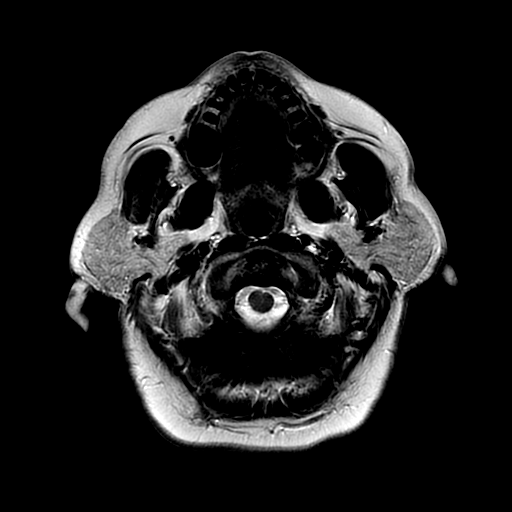
[im 25/25]
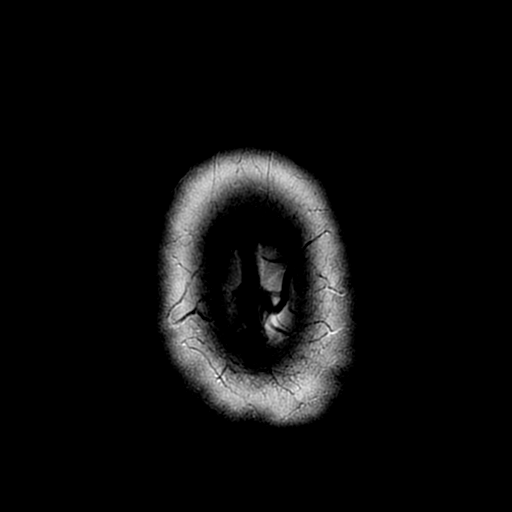

[Series 7: FLAIR · axial · 5.0mm · 0.47mm/px · z∈[-102,+41]mm · 2 of 25 slices shown (2 of 2)]
[im 1/25]
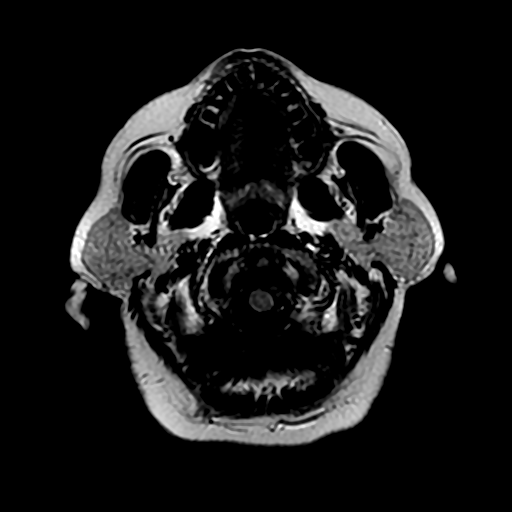
[im 25/25]
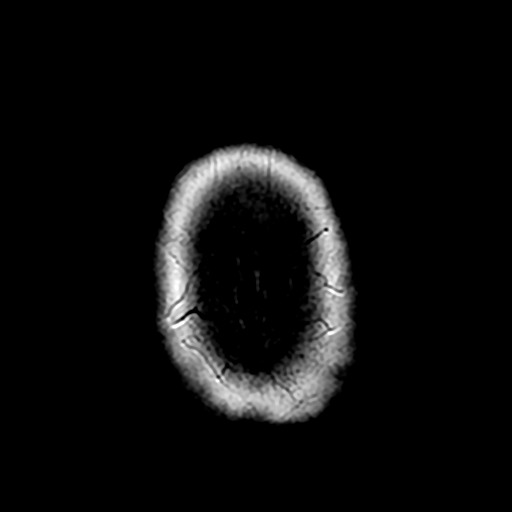

[Series 8: (person_name) · axial · 3.0mm · 0.47mm/px · z∈[-89,-71]mm · 2 of 100 slices shown]
[im 1/100]
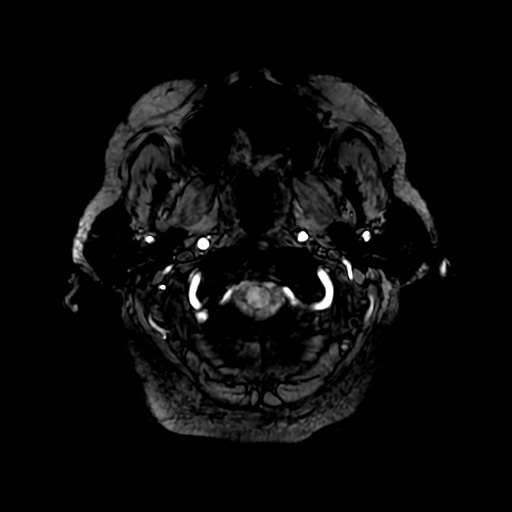
[im 13/100]
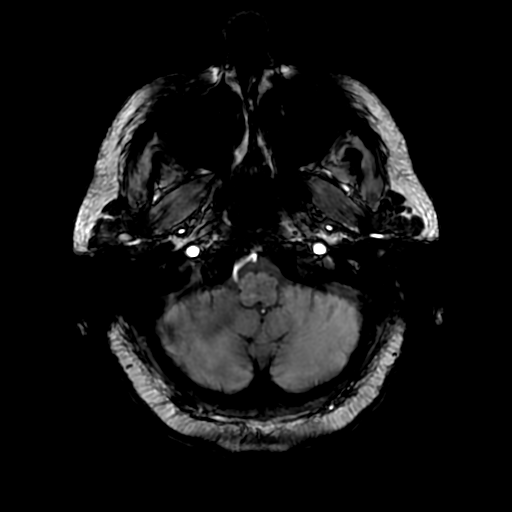

[Series 10: T2 · coronal · 5.0mm · 0.47mm/px · 3 of 29 slices shown (2 of 2)]
[im 1/29]
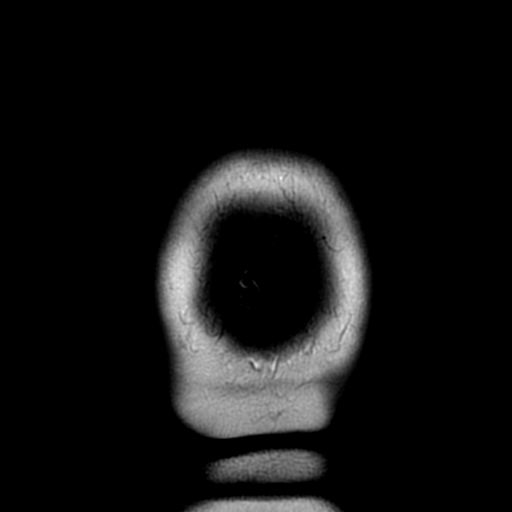
[im 15/29]
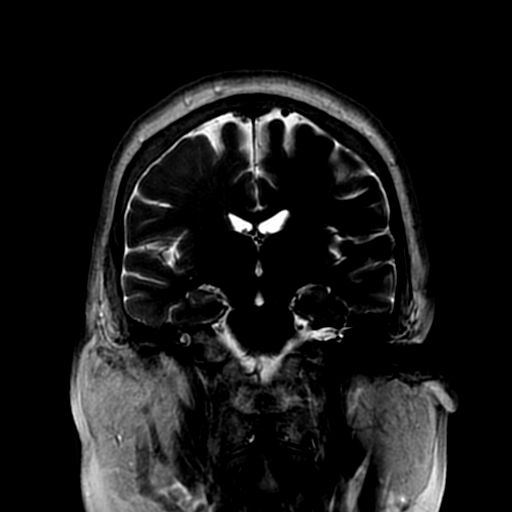
[im 29/29]
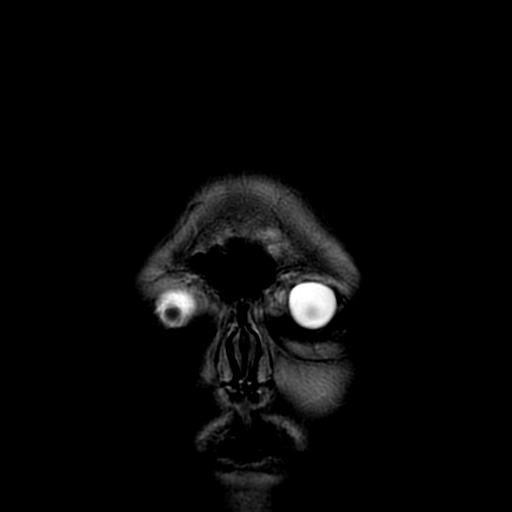

[Series 450: ADC · axial · 3.0mm · 0.94mm/px · z∈[-103,+43]mm · 5 of 50 slices shown (1 of 2)]
[im 1/50]
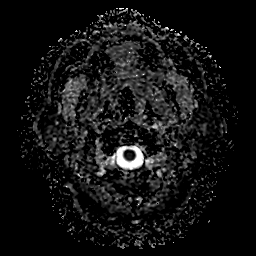
[im 13/50]
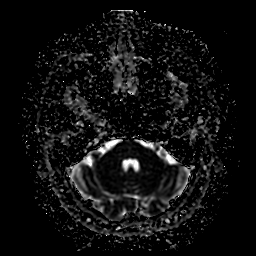
[im 25/50]
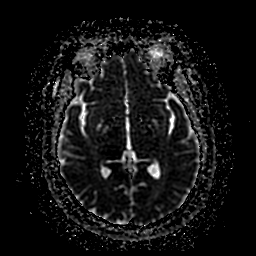
[im 37/50]
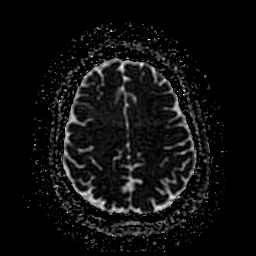
[im 50/50]
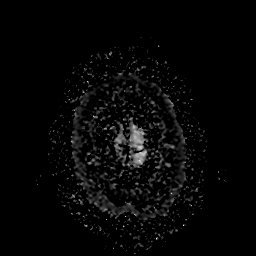

[Series 550: ADC · coronal · 4.0mm · 0.94mm/px · 3 of 36 slices shown (2 of 2)]
[im 1/36]
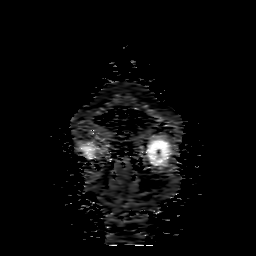
[im 18/36]
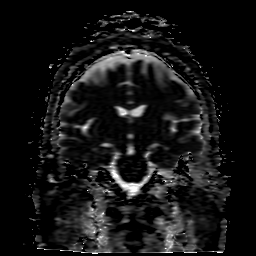
[im 36/36]
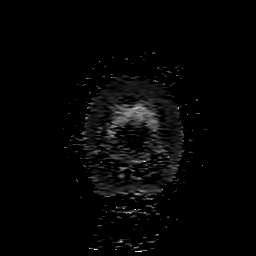

[36 of 48 positions shown; findings below may reference images not displayed]

FINDINGS: Brain: Cerebral volume within normal limits for age. No focal
parenchymal signal abnormality identified. No significant cerebral
white matter disease.

No abnormal foci of restricted diffusion to suggest acute or
subacute ischemia. Gray-white matter differentiation well
maintained. No evidence for acute or chronic intracranial
hemorrhage. No areas of chronic infarction identified.

No mass lesion, midline shift, or mass effect. No hydrocephalus. No
extra-axial fluid collection. Major dural sinuses are grossly
patent.

Pituitary gland within normal limits.

Vascular: Major intracranial vascular flow voids are maintained.

Skull and upper cervical spine: Craniocervical junction normal.
Visualized upper cervical spine unremarkable. Bone marrow signal
intensity within normal limits. No scalp soft tissue abnormality.

Sinuses/Orbits: Globes and orbital soft tissues within normal
limits. Paranasal sinuses are clear. No mastoid effusion. Inner ear
structures within normal limits.
IMPRESSION: Normal brain MRI.
